# Patient Record
Sex: Male | Born: 1990 | Race: Black or African American | Hispanic: No | Marital: Single | Smoking: Current every day smoker
Health system: Southern US, Community
[De-identification: ages and names within clinical notes are randomized; demographics above are authoritative.]

---

## 2016-05-07 ENCOUNTER — Emergency Department (HOSPITAL_COMMUNITY)
Admission: EM | Admit: 2016-05-07 | Discharge: 2016-05-07 | Disposition: A | Discharge status: Home / Self Care | Payer: Self-pay | Attending: Emergency Medicine | Admitting: Emergency Medicine

## 2016-05-07 ENCOUNTER — Encounter (HOSPITAL_COMMUNITY): Payer: Self-pay | Admitting: Emergency Medicine

## 2016-05-07 DIAGNOSIS — F17200 Nicotine dependence, unspecified, uncomplicated: Secondary | ICD-10-CM | POA: Insufficient documentation

## 2016-05-07 DIAGNOSIS — Z4802 Encounter for removal of sutures: Secondary | ICD-10-CM
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
## 2016-05-07 NOTE — ED Triage Notes (Signed)
Pt here to get sutures removed from left pinky and right forearm. Sutures have been in place since March 8th.

## 2017-12-21 ENCOUNTER — Emergency Department (HOSPITAL_COMMUNITY): Payer: No Typology Code available for payment source

## 2017-12-21 ENCOUNTER — Emergency Department (HOSPITAL_COMMUNITY)
Admission: EM | Admit: 2017-12-21 | Discharge: 2017-12-21 | Disposition: A | Discharge status: Home / Self Care | Payer: No Typology Code available for payment source | Attending: Emergency Medicine | Admitting: Emergency Medicine

## 2017-12-21 ENCOUNTER — Encounter (HOSPITAL_COMMUNITY): Payer: Self-pay | Admitting: Emergency Medicine

## 2017-12-21 DIAGNOSIS — M25511 Pain in right shoulder: Secondary | ICD-10-CM | POA: Diagnosis not present

## 2017-12-21 DIAGNOSIS — Y999 Unspecified external cause status: Secondary | ICD-10-CM | POA: Insufficient documentation

## 2017-12-21 DIAGNOSIS — S51812A Laceration without foreign body of left forearm, initial encounter: Secondary | ICD-10-CM | POA: Diagnosis not present

## 2017-12-21 DIAGNOSIS — F17210 Nicotine dependence, cigarettes, uncomplicated: Secondary | ICD-10-CM | POA: Diagnosis not present

## 2017-12-21 DIAGNOSIS — Y9389 Activity, other specified: Secondary | ICD-10-CM | POA: Diagnosis not present

## 2017-12-21 DIAGNOSIS — Y92410 Unspecified street and highway as the place of occurrence of the external cause: Secondary | ICD-10-CM | POA: Insufficient documentation

## 2017-12-21 DIAGNOSIS — S4991XA Unspecified injury of right shoulder and upper arm, initial encounter: Secondary | ICD-10-CM | POA: Diagnosis present

## 2017-12-21 DIAGNOSIS — M25561 Pain in right knee: Secondary | ICD-10-CM | POA: Insufficient documentation

## 2017-12-21 MED ORDER — IBUPROFEN 800 MG PO TABS: 800.0000 mg | ORAL_TABLET | Freq: Three times a day (TID) | ORAL | 0 refills | Status: AC | PRN

## 2017-12-21 NOTE — ED Notes (Signed)
Patient able to ambulate independently  

## 2017-12-21 NOTE — ED Notes (Signed)
Registration at bedside.

## 2017-12-21 NOTE — Discharge Instructions (Signed)
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
